# Patient Record
Sex: Female | Born: 2000
Health system: Southern US, Community
[De-identification: ages and names within clinical notes are randomized; demographics above are authoritative.]

## PROBLEM LIST (undated history)

## (undated) DIAGNOSIS — D6800 Von Willebrand disease, unspecified: Secondary | ICD-10-CM

## (undated) HISTORY — PX: NOSE SURGERY: SHX723

---
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
## 2018-01-13 ENCOUNTER — Ambulatory Visit (HOSPITAL_COMMUNITY)
Admission: EM | Admit: 2018-01-13 | Discharge: 2018-01-13 | Disposition: A | Discharge status: Home / Self Care | Payer: BLUE CROSS/BLUE SHIELD | Attending: Family Medicine | Admitting: Family Medicine

## 2018-01-13 ENCOUNTER — Encounter (HOSPITAL_COMMUNITY): Payer: Self-pay | Admitting: Emergency Medicine

## 2018-01-13 DIAGNOSIS — M25532 Pain in left wrist: Secondary | ICD-10-CM | POA: Diagnosis not present

## 2018-01-13 HISTORY — DX: Von Willebrand disease: D68.0

## 2018-01-13 HISTORY — DX: Von Willebrand disease, unspecified: D68.00

## 2018-01-13 MED ORDER — PREDNISONE 20 MG PO TABS: 40.0000 mg | ORAL_TABLET | Freq: Every day | ORAL | 0 refills | Status: AC

## 2018-01-13 MED ORDER — DICLOFENAC SODIUM 1 % TD GEL: 2.0000 g | Freq: Four times a day (QID) | TRANSDERMAL | 0 refills | Status: AC

## 2018-01-13 NOTE — ED Provider Notes (Signed)
[facility]    CSN: [id number] Arrival date & time: 01/13/18  1628     History   Chief Complaint Chief Complaint  Patient presents with  . Wrist Pain    HPI Angelica Christian is a 17 y.o. female.   17 year old female comes in for 3-day history of left wrist pain.  Patient states she has had history of fractures to both wrists, as well as tendinitis.  No known new injury.  Work does require repetitive motion of the wrist.  Patient with history of von Willebrand disease, she has been taking ibuprofen intermittently for the pain, but has been told not to take it excessively.  She denies any swelling.  Pain at rest, worse with movement.  Pain mostly on the radial aspect of the wrist, can radiate throughout her wrist during movement.  Denies numbness, tingling.  Denies decreased range of motion.      Past Medical History:  Diagnosis Date  . Von Willebrand disease (HCC)     There are no active problems to display for this patient.   Past Surgical History:  Procedure Laterality Date  . NOSE SURGERY      OB History    No data available       Home Medications    Prior to Admission medications   Medication Sig Start Date End Date Taking? Authorizing Provider  FLUoxetine (PROZAC) 20 MG tablet Take 20 mg by mouth daily.   Yes [provider]  NON FORMULARY    Yes [provider]  diclofenac sodium (VOLTAREN) 1 % GEL Apply 2 g topically 4 (four) times daily. 01/13/18   Cathie Hoops, Tamryn Popko V, PA-C  predniSONE (DELTASONE) 20 MG tablet Take 2 tablets (40 mg total) by mouth daily for 3 days. 01/13/18 01/16/18  Cathie Hoops, Carena Stream V, PA-C  triamcinolone (KENALOG) 0.1 % paste Use as directed 1 application in the mouth or throat 2 (two) times daily. 12/18/17   Mardella Layman, MD    Family History Family History  Problem Relation Age of Onset  . Hypertension Father     Social History Social History   Tobacco Use  . Smoking status: Never Smoker  Substance Use Topics  .  Alcohol use: Yes  . Drug use: No     Allergies   Patient has no known allergies.   Review of Systems Review of Systems  Reason unable to perform ROS: See HPI as above.     Physical Exam Triage Vital Signs ED Triage Vitals  Enc Vitals Group     BP 01/13/18 1712 117/61     Pulse Rate 01/13/18 1712 65     Resp 01/13/18 1712 16     Temp 01/13/18 1712 98.6 F (37 C)     Temp Source 01/13/18 1712 Oral     SpO2 01/13/18 1712 100 %     Weight 01/13/18 1713 145 lb (65.8 kg)     Height 01/13/18 1713 5\' 5"  (1.651 m)     Head Circumference --      Peak Flow --      Pain Score 01/13/18 1713 6     Pain Loc --      Pain Edu? --      Excl. in GC? --    No data found.  Updated Vital Signs BP 117/61   Pulse 65   Temp 98.6 F (37 C) (Oral)   Resp 16   Ht 5\' 5"  (1.651 m)   Wt 145 lb (65.8 kg)  LMP 12/23/2017   SpO2 100%   BMI 24.13 kg/m   Physical Exam  Constitutional: She is oriented to person, place, and time. She appears well-developed and well-nourished. No distress.  HENT:  Head: Normocephalic and atraumatic.  Eyes: Conjunctivae are normal. Pupils are equal, round, and reactive to light.  Musculoskeletal:  No obvious swelling, erythema, increased warmth, contusion.  Tenderness to palpation of radial aspect of wrist, along first MCP.  Tenderness to palpation of proximal MCPs.  Full range of motion, though flexion causes worsening of pain.  Strength normal and equal bilaterally.  Sensation intact ankle bilaterally.  Radial pulse 2+ and equal.  Cap refill less than 2 seconds.  Positive Finkelstein's.  Negative Tinel's, Phalen's.  Neurological: She is alert and oriented to person, place, and time.    UC Treatments / Results  Labs (all labs ordered are listed, but only abnormal results are displayed) Labs Reviewed - No data to display  EKG  EKG Interpretation None       Radiology No results found.  Procedures Procedures (including critical care  time)  Medications Ordered in UC Medications - No data to display   Initial Impression / Assessment and Plan / UC Course  I have reviewed the triage vital signs and the nursing notes.  Pertinent labs & imaging results that were available during my care of the patient were reviewed by me and considered in my medical decision making (see chart for details).    Given history of von Willebrand's, will defer oral NSAIDs for now.  Start prednisone for inflammation.  Tylenol for pain. Voltaren gel as needed for additional pain relief.  Wrist splint during activity.  Patient already established with orthopedics, will have patient follow-up with orthopedic for further evaluation and management needed.  Return precautions given.  Patient expresses understanding and agrees to plan.  Final Clinical Impressions(s) / UC Diagnoses   Final diagnoses:  Left wrist pain    ED Discharge Orders        Ordered    predniSONE (DELTASONE) 20 MG tablet  Daily     01/13/18 1803    diclofenac sodium (VOLTAREN) 1 % GEL  4 times daily     01/13/18 1804        Belinda FisherYu, Thorvald Orsino V, PA-C 01/13/18 1810

## 2018-01-13 NOTE — Discharge Instructions (Signed)
Start prednisone to decrease inflammation.  Ice compress.  Tylenol for pain.  Can use Voltaren gel as needed for pain relief.  Wrist splint during activity.  Follow-up with orthopedics for further evaluation and treatment needed.

## 2018-01-13 NOTE — ED Triage Notes (Signed)
PT reports severe left wrist pain since Friday. PT reports tendonitis in both wrists and previous breaks to both wrists. No new injury.

## 2018-01-26 ENCOUNTER — Ambulatory Visit (HOSPITAL_COMMUNITY)
Admission: EM | Admit: 2018-01-26 | Discharge: 2018-01-26 | Disposition: A | Discharge status: Home / Self Care | Payer: BLUE CROSS/BLUE SHIELD | Attending: Internal Medicine | Admitting: Internal Medicine

## 2018-01-26 ENCOUNTER — Other Ambulatory Visit: Payer: Self-pay

## 2018-01-26 DIAGNOSIS — J029 Acute pharyngitis, unspecified: Secondary | ICD-10-CM | POA: Insufficient documentation

## 2018-01-26 DIAGNOSIS — D680 Von Willebrand's disease: Secondary | ICD-10-CM | POA: Insufficient documentation

## 2018-01-26 LAB — POCT RAPID STREP A: Streptococcus, Group A Screen (Direct): NEGATIVE

## 2018-01-26 MED ORDER — LIDOCAINE VISCOUS 2 % MT SOLN: 5.0000 mL | OROMUCOSAL | 0 refills | Status: AC | PRN

## 2018-01-26 NOTE — ED Provider Notes (Signed)
01/26/2018 12:49 PM   DOB: [birth year] / MRN: [id number]  SUBJECTIVE:  Angelica Christian is a 17 y.o. female presenting for sore throat times 3 days.  Patient denies fever.  States she always has an elevated heart rate.  She denies cough, nasal congestion, nausea.  She is eating well.  Urinating normally for her.  She does have a history of von Willebrand is not supposed to take NSAIDs however she does from time to time.  She has also been taking amoxicillin without any relief.  She has no history of mono.  She has No Known Allergies.   She  has a past medical history of Von Willebrand disease (HCC).    She  reports that  has never smoked. She does not have any smokeless tobacco history on file. She reports that she drinks alcohol. She reports that she does not use drugs. She  has no sexual activity history on file. The patient  has a past surgical history that includes Nose surgery.  Her family history includes Hypertension in her father.  ROS  Per HPI  OBJECTIVE:  BP (!) 117/57 (BP Location: Left Arm)   Pulse (!) 116   Temp 98.4 F (36.9 C) (Oral)   LMP 01/08/2018 (Approximate)   SpO2 100%   Physical Exam  Constitutional: She is active.  Non-toxic appearance.  HENT:  Right Ear: Hearing, tympanic membrane, external ear and ear canal normal.  Left Ear: Hearing, tympanic membrane, external ear and ear canal normal.  Nose: Nose normal. Right sinus exhibits no maxillary sinus tenderness and no frontal sinus tenderness. Left sinus exhibits no maxillary sinus tenderness and no frontal sinus tenderness.  Mouth/Throat: Uvula is midline, oropharynx is clear and moist and mucous membranes are normal. Mucous membranes are not dry. No oropharyngeal exudate, posterior oropharyngeal edema or tonsillar abscesses.  Cardiovascular: Normal rate, regular rhythm, S1 normal, S2 normal, normal heart sounds and intact distal pulses. Exam reveals no gallop, no friction rub and no decreased pulses.  No murmur  heard. Heart rate 96 by auscultation.  Pulmonary/Chest: Effort normal. No stridor. No tachypnea. No respiratory distress. She has no wheezes. She has no rales.  Abdominal: She exhibits no distension.  Musculoskeletal: She exhibits no edema.  Lymphadenopathy:       Head (right side): No submandibular and no tonsillar adenopathy present.       Head (left side): No submandibular and no tonsillar adenopathy present.    She has no cervical adenopathy.  Neurological: She is alert.  Skin: Skin is warm and dry. She is not diaphoretic. No pallor.    Results for orders placed or performed during the hospital encounter of 01/26/18 (from the past 72 hour(s))  POCT rapid strep A [facility])     Status: None   Collection Time: 01/26/18 12:44 PM  Result Value Ref Range   Streptococcus, Group A Screen (Direct) NEGATIVE NEGATIVE    No results found.  ASSESSMENT AND PLAN:  Orders Placed This Encounter  Procedures  . Culture, group A strep (throat)    Standing Status:   Standing    Number of Occurrences:   1  . POCT rapid strep A [facility])    Standing Status:   Standing    Number of Occurrences:   1     Sore throat      The patient is advised to call or return to clinic if she does not see an improvement in symptoms, or to seek the care of  the closest emergency department if she worsens with the above plan.   Deliah BostonMichael Clark, MHS, PA-C 01/26/2018 12:49 PM    Ofilia Neaslark, Michael L, PA-C 01/26/18 1250

## 2018-01-26 NOTE — ED Triage Notes (Signed)
Sore throat that started on Friday, some headaches

## 2018-01-26 NOTE — Discharge Instructions (Signed)
For generalized aches, pains, sore throat is okay to take Tylenol [phone] mg every 6-8 hours.  It is okay to continue your amoxicillin at thousand milligrams twice daily.  It is unlikely that we will be able to pick up on strep today given that you have already been taking amoxicillin.  If your symptoms persist for greater than 10 days and you have no other symptoms, such as runny nose, cough, then this is likely mono.  Unfortunately there is no  cure however Tylenol should help ease the symptoms and in time, roughly 6 weeks you should be asymptomatic.

## 2018-01-29 LAB — CULTURE, GROUP A STREP (THRC)

## 2018-01-29 NOTE — Progress Notes (Signed)
Please call and advise she stop the zpack.  RTC or follow up with PCP if sore throat continues. Deliah BostonMichael Clark, MS, PA-C 4:24 PM, 01/29/2018

## 2018-04-10 DIAGNOSIS — R293 Abnormal posture: Secondary | ICD-10-CM | POA: Diagnosis not present

## 2018-04-10 DIAGNOSIS — G43109 Migraine with aura, not intractable, without status migrainosus: Secondary | ICD-10-CM | POA: Diagnosis not present

## 2018-04-10 DIAGNOSIS — M9902 Segmental and somatic dysfunction of thoracic region: Secondary | ICD-10-CM | POA: Diagnosis not present

## 2018-04-10 DIAGNOSIS — M9901 Segmental and somatic dysfunction of cervical region: Secondary | ICD-10-CM | POA: Diagnosis not present

## 2018-04-17 DIAGNOSIS — R293 Abnormal posture: Secondary | ICD-10-CM | POA: Diagnosis not present

## 2018-04-17 DIAGNOSIS — G43109 Migraine with aura, not intractable, without status migrainosus: Secondary | ICD-10-CM | POA: Diagnosis not present

## 2018-04-17 DIAGNOSIS — M9902 Segmental and somatic dysfunction of thoracic region: Secondary | ICD-10-CM | POA: Diagnosis not present

## 2018-04-17 DIAGNOSIS — M9901 Segmental and somatic dysfunction of cervical region: Secondary | ICD-10-CM | POA: Diagnosis not present

## 2018-04-22 DIAGNOSIS — M9902 Segmental and somatic dysfunction of thoracic region: Secondary | ICD-10-CM | POA: Diagnosis not present

## 2018-04-22 DIAGNOSIS — G43109 Migraine with aura, not intractable, without status migrainosus: Secondary | ICD-10-CM | POA: Diagnosis not present

## 2018-04-22 DIAGNOSIS — R293 Abnormal posture: Secondary | ICD-10-CM | POA: Diagnosis not present

## 2018-04-22 DIAGNOSIS — M9901 Segmental and somatic dysfunction of cervical region: Secondary | ICD-10-CM | POA: Diagnosis not present

## 2018-04-23 DIAGNOSIS — M9901 Segmental and somatic dysfunction of cervical region: Secondary | ICD-10-CM | POA: Diagnosis not present

## 2018-04-23 DIAGNOSIS — G43109 Migraine with aura, not intractable, without status migrainosus: Secondary | ICD-10-CM | POA: Diagnosis not present

## 2018-04-23 DIAGNOSIS — M9902 Segmental and somatic dysfunction of thoracic region: Secondary | ICD-10-CM | POA: Diagnosis not present

## 2018-04-23 DIAGNOSIS — R293 Abnormal posture: Secondary | ICD-10-CM | POA: Diagnosis not present

## 2018-04-24 DIAGNOSIS — G43109 Migraine with aura, not intractable, without status migrainosus: Secondary | ICD-10-CM | POA: Diagnosis not present

## 2018-04-24 DIAGNOSIS — M9901 Segmental and somatic dysfunction of cervical region: Secondary | ICD-10-CM | POA: Diagnosis not present

## 2018-04-24 DIAGNOSIS — R293 Abnormal posture: Secondary | ICD-10-CM | POA: Diagnosis not present

## 2018-04-24 DIAGNOSIS — M9902 Segmental and somatic dysfunction of thoracic region: Secondary | ICD-10-CM | POA: Diagnosis not present

## 2018-04-29 DIAGNOSIS — M9901 Segmental and somatic dysfunction of cervical region: Secondary | ICD-10-CM | POA: Diagnosis not present

## 2018-04-29 DIAGNOSIS — M9902 Segmental and somatic dysfunction of thoracic region: Secondary | ICD-10-CM | POA: Diagnosis not present

## 2018-04-29 DIAGNOSIS — R293 Abnormal posture: Secondary | ICD-10-CM | POA: Diagnosis not present

## 2018-04-29 DIAGNOSIS — G43109 Migraine with aura, not intractable, without status migrainosus: Secondary | ICD-10-CM | POA: Diagnosis not present

## 2018-04-30 DIAGNOSIS — M9902 Segmental and somatic dysfunction of thoracic region: Secondary | ICD-10-CM | POA: Diagnosis not present

## 2018-04-30 DIAGNOSIS — R293 Abnormal posture: Secondary | ICD-10-CM | POA: Diagnosis not present

## 2018-04-30 DIAGNOSIS — G43109 Migraine with aura, not intractable, without status migrainosus: Secondary | ICD-10-CM | POA: Diagnosis not present

## 2018-04-30 DIAGNOSIS — M9901 Segmental and somatic dysfunction of cervical region: Secondary | ICD-10-CM | POA: Diagnosis not present

## 2018-05-01 DIAGNOSIS — M9902 Segmental and somatic dysfunction of thoracic region: Secondary | ICD-10-CM | POA: Diagnosis not present

## 2018-05-01 DIAGNOSIS — R293 Abnormal posture: Secondary | ICD-10-CM | POA: Diagnosis not present

## 2018-05-01 DIAGNOSIS — M9901 Segmental and somatic dysfunction of cervical region: Secondary | ICD-10-CM | POA: Diagnosis not present

## 2018-05-01 DIAGNOSIS — G43109 Migraine with aura, not intractable, without status migrainosus: Secondary | ICD-10-CM | POA: Diagnosis not present

## 2018-05-13 DIAGNOSIS — R293 Abnormal posture: Secondary | ICD-10-CM | POA: Diagnosis not present

## 2018-05-13 DIAGNOSIS — G43109 Migraine with aura, not intractable, without status migrainosus: Secondary | ICD-10-CM | POA: Diagnosis not present

## 2018-05-13 DIAGNOSIS — M9902 Segmental and somatic dysfunction of thoracic region: Secondary | ICD-10-CM | POA: Diagnosis not present

## 2018-05-13 DIAGNOSIS — M9901 Segmental and somatic dysfunction of cervical region: Secondary | ICD-10-CM | POA: Diagnosis not present

## 2018-05-14 DIAGNOSIS — R293 Abnormal posture: Secondary | ICD-10-CM | POA: Diagnosis not present

## 2018-05-14 DIAGNOSIS — G43109 Migraine with aura, not intractable, without status migrainosus: Secondary | ICD-10-CM | POA: Diagnosis not present

## 2018-05-14 DIAGNOSIS — M9902 Segmental and somatic dysfunction of thoracic region: Secondary | ICD-10-CM | POA: Diagnosis not present

## 2018-05-14 DIAGNOSIS — M9901 Segmental and somatic dysfunction of cervical region: Secondary | ICD-10-CM | POA: Diagnosis not present

## 2018-05-15 DIAGNOSIS — G43109 Migraine with aura, not intractable, without status migrainosus: Secondary | ICD-10-CM | POA: Diagnosis not present

## 2018-05-15 DIAGNOSIS — M9901 Segmental and somatic dysfunction of cervical region: Secondary | ICD-10-CM | POA: Diagnosis not present

## 2018-05-15 DIAGNOSIS — R293 Abnormal posture: Secondary | ICD-10-CM | POA: Diagnosis not present

## 2018-05-15 DIAGNOSIS — M9902 Segmental and somatic dysfunction of thoracic region: Secondary | ICD-10-CM | POA: Diagnosis not present

## 2018-05-29 DIAGNOSIS — M9902 Segmental and somatic dysfunction of thoracic region: Secondary | ICD-10-CM | POA: Diagnosis not present

## 2018-05-29 DIAGNOSIS — M9901 Segmental and somatic dysfunction of cervical region: Secondary | ICD-10-CM | POA: Diagnosis not present

## 2018-05-29 DIAGNOSIS — R293 Abnormal posture: Secondary | ICD-10-CM | POA: Diagnosis not present

## 2018-05-29 DIAGNOSIS — G43109 Migraine with aura, not intractable, without status migrainosus: Secondary | ICD-10-CM | POA: Diagnosis not present

## 2018-06-12 ENCOUNTER — Encounter: Payer: BLUE CROSS/BLUE SHIELD | Admitting: Obstetrics and Gynecology

## 2018-06-17 DIAGNOSIS — R293 Abnormal posture: Secondary | ICD-10-CM | POA: Diagnosis not present

## 2018-06-17 DIAGNOSIS — M9901 Segmental and somatic dysfunction of cervical region: Secondary | ICD-10-CM | POA: Diagnosis not present

## 2018-06-17 DIAGNOSIS — G43109 Migraine with aura, not intractable, without status migrainosus: Secondary | ICD-10-CM | POA: Diagnosis not present

## 2018-06-17 DIAGNOSIS — M9902 Segmental and somatic dysfunction of thoracic region: Secondary | ICD-10-CM | POA: Diagnosis not present

## 2018-06-18 DIAGNOSIS — G43109 Migraine with aura, not intractable, without status migrainosus: Secondary | ICD-10-CM | POA: Diagnosis not present

## 2018-06-18 DIAGNOSIS — R293 Abnormal posture: Secondary | ICD-10-CM | POA: Diagnosis not present

## 2018-06-18 DIAGNOSIS — M9902 Segmental and somatic dysfunction of thoracic region: Secondary | ICD-10-CM | POA: Diagnosis not present

## 2018-06-18 DIAGNOSIS — M9901 Segmental and somatic dysfunction of cervical region: Secondary | ICD-10-CM | POA: Diagnosis not present

## 2018-06-19 DIAGNOSIS — M9901 Segmental and somatic dysfunction of cervical region: Secondary | ICD-10-CM | POA: Diagnosis not present

## 2018-06-19 DIAGNOSIS — G43109 Migraine with aura, not intractable, without status migrainosus: Secondary | ICD-10-CM | POA: Diagnosis not present

## 2018-06-19 DIAGNOSIS — M9902 Segmental and somatic dysfunction of thoracic region: Secondary | ICD-10-CM | POA: Diagnosis not present

## 2018-06-19 DIAGNOSIS — R293 Abnormal posture: Secondary | ICD-10-CM | POA: Diagnosis not present

## 2018-06-24 DIAGNOSIS — G43109 Migraine with aura, not intractable, without status migrainosus: Secondary | ICD-10-CM | POA: Diagnosis not present

## 2018-06-24 DIAGNOSIS — M9902 Segmental and somatic dysfunction of thoracic region: Secondary | ICD-10-CM | POA: Diagnosis not present

## 2018-06-24 DIAGNOSIS — M9901 Segmental and somatic dysfunction of cervical region: Secondary | ICD-10-CM | POA: Diagnosis not present

## 2018-06-24 DIAGNOSIS — R293 Abnormal posture: Secondary | ICD-10-CM | POA: Diagnosis not present

## 2018-06-26 DIAGNOSIS — M9901 Segmental and somatic dysfunction of cervical region: Secondary | ICD-10-CM | POA: Diagnosis not present

## 2018-06-26 DIAGNOSIS — R293 Abnormal posture: Secondary | ICD-10-CM | POA: Diagnosis not present

## 2018-06-26 DIAGNOSIS — M9902 Segmental and somatic dysfunction of thoracic region: Secondary | ICD-10-CM | POA: Diagnosis not present

## 2018-06-26 DIAGNOSIS — G43109 Migraine with aura, not intractable, without status migrainosus: Secondary | ICD-10-CM | POA: Diagnosis not present

## 2018-06-27 ENCOUNTER — Encounter: Payer: BLUE CROSS/BLUE SHIELD | Admitting: Obstetrics and Gynecology

## 2018-07-01 DIAGNOSIS — M9902 Segmental and somatic dysfunction of thoracic region: Secondary | ICD-10-CM | POA: Diagnosis not present

## 2018-07-01 DIAGNOSIS — G43109 Migraine with aura, not intractable, without status migrainosus: Secondary | ICD-10-CM | POA: Diagnosis not present

## 2018-07-01 DIAGNOSIS — M9901 Segmental and somatic dysfunction of cervical region: Secondary | ICD-10-CM | POA: Diagnosis not present

## 2018-07-01 DIAGNOSIS — R293 Abnormal posture: Secondary | ICD-10-CM | POA: Diagnosis not present

## 2018-07-03 DIAGNOSIS — M9902 Segmental and somatic dysfunction of thoracic region: Secondary | ICD-10-CM | POA: Diagnosis not present

## 2018-07-03 DIAGNOSIS — R293 Abnormal posture: Secondary | ICD-10-CM | POA: Diagnosis not present

## 2018-07-03 DIAGNOSIS — G43109 Migraine with aura, not intractable, without status migrainosus: Secondary | ICD-10-CM | POA: Diagnosis not present

## 2018-07-03 DIAGNOSIS — M9901 Segmental and somatic dysfunction of cervical region: Secondary | ICD-10-CM | POA: Diagnosis not present

## 2018-08-14 DIAGNOSIS — J302 Other seasonal allergic rhinitis: Secondary | ICD-10-CM | POA: Diagnosis not present

## 2018-08-22 DIAGNOSIS — Z113 Encounter for screening for infections with a predominantly sexual mode of transmission: Secondary | ICD-10-CM | POA: Diagnosis not present

## 2018-08-22 DIAGNOSIS — Z01419 Encounter for gynecological examination (general) (routine) without abnormal findings: Secondary | ICD-10-CM | POA: Diagnosis not present

## 2018-08-22 DIAGNOSIS — Z6826 Body mass index (BMI) 26.0-26.9, adult: Secondary | ICD-10-CM | POA: Diagnosis not present

## 2018-09-02 DIAGNOSIS — R05 Cough: Secondary | ICD-10-CM | POA: Diagnosis not present

## 2018-09-02 DIAGNOSIS — Z23 Encounter for immunization: Secondary | ICD-10-CM | POA: Diagnosis not present

## 2018-09-02 DIAGNOSIS — J029 Acute pharyngitis, unspecified: Secondary | ICD-10-CM | POA: Diagnosis not present

## 2018-09-02 DIAGNOSIS — R509 Fever, unspecified: Secondary | ICD-10-CM | POA: Diagnosis not present

## 2018-10-29 DIAGNOSIS — J069 Acute upper respiratory infection, unspecified: Secondary | ICD-10-CM | POA: Diagnosis not present

## 2018-10-29 DIAGNOSIS — R05 Cough: Secondary | ICD-10-CM | POA: Diagnosis not present

## 2018-10-29 DIAGNOSIS — R062 Wheezing: Secondary | ICD-10-CM | POA: Diagnosis not present

## 2018-11-20 DIAGNOSIS — J40 Bronchitis, not specified as acute or chronic: Secondary | ICD-10-CM | POA: Diagnosis not present

## 2018-11-20 DIAGNOSIS — B9689 Other specified bacterial agents as the cause of diseases classified elsewhere: Secondary | ICD-10-CM | POA: Diagnosis not present

## 2018-11-20 DIAGNOSIS — J329 Chronic sinusitis, unspecified: Secondary | ICD-10-CM | POA: Diagnosis not present

## 2018-12-17 DIAGNOSIS — Z23 Encounter for immunization: Secondary | ICD-10-CM | POA: Diagnosis not present

## 2018-12-17 DIAGNOSIS — Z13220 Encounter for screening for lipoid disorders: Secondary | ICD-10-CM | POA: Diagnosis not present

## 2018-12-17 DIAGNOSIS — Z0000 Encounter for general adult medical examination without abnormal findings: Secondary | ICD-10-CM | POA: Diagnosis not present

## 2018-12-17 DIAGNOSIS — F419 Anxiety disorder, unspecified: Secondary | ICD-10-CM | POA: Diagnosis not present

## 2018-12-17 DIAGNOSIS — R5383 Other fatigue: Secondary | ICD-10-CM | POA: Diagnosis not present

## 2018-12-17 DIAGNOSIS — Z6826 Body mass index (BMI) 26.0-26.9, adult: Secondary | ICD-10-CM | POA: Diagnosis not present

## 2019-01-22 DIAGNOSIS — Z23 Encounter for immunization: Secondary | ICD-10-CM | POA: Diagnosis not present

## 2019-03-05 DIAGNOSIS — R300 Dysuria: Secondary | ICD-10-CM | POA: Diagnosis not present

## 2019-03-09 DIAGNOSIS — F909 Attention-deficit hyperactivity disorder, unspecified type: Secondary | ICD-10-CM | POA: Diagnosis not present

## 2019-08-15 DIAGNOSIS — Z20828 Contact with and (suspected) exposure to other viral communicable diseases: Secondary | ICD-10-CM | POA: Diagnosis not present

## 2019-08-15 DIAGNOSIS — R51 Headache: Secondary | ICD-10-CM | POA: Diagnosis not present

## 2019-08-15 DIAGNOSIS — R070 Pain in throat: Secondary | ICD-10-CM | POA: Diagnosis not present

## 2019-09-21 DIAGNOSIS — Z23 Encounter for immunization: Secondary | ICD-10-CM | POA: Diagnosis not present

## 2019-09-21 DIAGNOSIS — F419 Anxiety disorder, unspecified: Secondary | ICD-10-CM | POA: Diagnosis not present

## 2019-09-22 DIAGNOSIS — Z6828 Body mass index (BMI) 28.0-28.9, adult: Secondary | ICD-10-CM | POA: Diagnosis not present

## 2019-09-22 DIAGNOSIS — Z01419 Encounter for gynecological examination (general) (routine) without abnormal findings: Secondary | ICD-10-CM | POA: Diagnosis not present

## 2019-09-22 DIAGNOSIS — Z113 Encounter for screening for infections with a predominantly sexual mode of transmission: Secondary | ICD-10-CM | POA: Diagnosis not present

## 2020-01-18 ENCOUNTER — Encounter (HOSPITAL_COMMUNITY): Payer: Self-pay | Admitting: Emergency Medicine

## 2020-01-18 ENCOUNTER — Emergency Department (HOSPITAL_COMMUNITY): Payer: BC Managed Care – PPO

## 2020-01-18 ENCOUNTER — Emergency Department (HOSPITAL_COMMUNITY)
Admission: EM | Admit: 2020-01-18 | Discharge: 2020-01-18 | Disposition: A | Discharge status: Home / Self Care | Payer: BC Managed Care – PPO | Attending: Emergency Medicine | Admitting: Emergency Medicine

## 2020-01-18 DIAGNOSIS — Y999 Unspecified external cause status: Secondary | ICD-10-CM | POA: Insufficient documentation

## 2020-01-18 DIAGNOSIS — S3992XA Unspecified injury of lower back, initial encounter: Secondary | ICD-10-CM | POA: Diagnosis not present

## 2020-01-18 DIAGNOSIS — Y9389 Activity, other specified: Secondary | ICD-10-CM | POA: Diagnosis not present

## 2020-01-18 DIAGNOSIS — R519 Headache, unspecified: Secondary | ICD-10-CM | POA: Diagnosis not present

## 2020-01-18 DIAGNOSIS — Y92410 Unspecified street and highway as the place of occurrence of the external cause: Secondary | ICD-10-CM | POA: Diagnosis not present

## 2020-01-18 DIAGNOSIS — M546 Pain in thoracic spine: Secondary | ICD-10-CM | POA: Diagnosis not present

## 2020-01-18 DIAGNOSIS — M542 Cervicalgia: Secondary | ICD-10-CM | POA: Insufficient documentation

## 2020-01-18 DIAGNOSIS — M549 Dorsalgia, unspecified: Secondary | ICD-10-CM | POA: Insufficient documentation

## 2020-01-18 DIAGNOSIS — M4322 Fusion of spine, cervical region: Secondary | ICD-10-CM | POA: Diagnosis not present

## 2020-01-18 DIAGNOSIS — M545 Low back pain: Secondary | ICD-10-CM | POA: Diagnosis not present

## 2020-01-18 DIAGNOSIS — S299XXA Unspecified injury of thorax, initial encounter: Secondary | ICD-10-CM | POA: Diagnosis not present

## 2020-01-18 DIAGNOSIS — S0990XA Unspecified injury of head, initial encounter: Secondary | ICD-10-CM | POA: Diagnosis not present

## 2020-01-18 DIAGNOSIS — Z79899 Other long term (current) drug therapy: Secondary | ICD-10-CM | POA: Diagnosis not present

## 2020-01-18 DIAGNOSIS — S199XXA Unspecified injury of neck, initial encounter: Secondary | ICD-10-CM | POA: Diagnosis not present

## 2020-01-18 LAB — POC URINE PREG, ED: Preg Test, Ur: NEGATIVE

## 2020-01-18 MED ORDER — METOCLOPRAMIDE HCL 10 MG PO TABS
10.0000 mg | ORAL_TABLET | Freq: Once | ORAL | Status: AC
  Administered 2020-01-18: 10 mg via ORAL
  Filled 2020-01-18: qty 1

## 2020-01-18 MED ORDER — METHOCARBAMOL 500 MG PO TABS: 500.0000 mg | ORAL_TABLET | Freq: Two times a day (BID) | ORAL | 0 refills | Status: AC

## 2020-01-18 MED ORDER — DEXAMETHASONE SODIUM PHOSPHATE 10 MG/ML IJ SOLN
10.0000 mg | Freq: Once | INTRAMUSCULAR | Status: AC
  Administered 2020-01-18: 10 mg via INTRAMUSCULAR
  Filled 2020-01-18: qty 1

## 2020-01-18 MED ORDER — METOCLOPRAMIDE HCL 10 MG PO TABS: 10.0000 mg | ORAL_TABLET | Freq: Four times a day (QID) | ORAL | 0 refills | Status: AC

## 2020-01-18 MED ORDER — ACETAMINOPHEN 325 MG PO TABS
650.0000 mg | ORAL_TABLET | Freq: Once | ORAL | Status: AC
  Administered 2020-01-18: 650 mg via ORAL
  Filled 2020-01-18: qty 2

## 2020-01-18 MED ORDER — LIDOCAINE 5 % EX PTCH: 1.0000 | MEDICATED_PATCH | CUTANEOUS | 0 refills | Status: AC

## 2020-01-18 NOTE — ED Notes (Signed)
Pt given cheese and crackers 
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
## 2020-01-18 NOTE — ED Notes (Signed)
Pt up to restroom.
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
## 2020-01-18 NOTE — Discharge Instructions (Signed)
Expect your soreness to increase over the next 2-3 days. Take it easy, but do not lay around too much as this may make any stiffness worse.  Antiinflammatory medications: Take 600 mg of ibuprofen every 6 hours or 440 mg (over the counter dose) to 500 mg (prescription dose) of naproxen every 12 hours for the next 3 days. After this time, these medications may be used as needed for pain. Take these medications with food to avoid upset stomach. Choose only one of these medications, do not take them together. Acetaminophen (generic for Tylenol): Should you continue to have additional pain while taking the ibuprofen or naproxen, you may add in acetaminophen as needed. Your daily total maximum amount of acetaminophen from all sources should be limited to 4000mg /day for persons without liver problems, or 2000mg /day for those with liver problems. Methocarbamol: Methocarbamol (generic for Robaxin) is a muscle relaxer and can help relieve stiff muscles or muscle spasms.  Do not drive or perform other dangerous activities while taking this medication as it can cause drowsiness as well as changes in reaction time and judgement. Lidocaine patches: These are available via either prescription or over-the-counter. The over-the-counter option may be more economical one and are likely just as effective. There are multiple over-the-counter brands, such as Salonpas. Ice: May apply ice to the area over the next 24 hours for 15 minutes at a time to reduce pain, inflammation, and swelling, if present. Exercises: Be sure to perform the attached exercises starting with three times a week and working up to performing them daily. This is an essential part of preventing long term problems.  Follow up: Follow up with a primary care provider for any future management of these complaints. Be sure to follow up within 7-10 days. Return: Return to the ED should symptoms worsen.  For prescription assistance, may try using prescription  discount sites or apps, such as goodrx.com   Head Injury or Concussion symptoms You have been seen today for a possible concussion. It does not appear to be serious at this time.  Close observation: The close observation period is usually 6 hours from the injury. This includes staying awake and having a trustworthy adult monitor you to assure your condition does not worsen. You should be in regular contact with this person and ideally, they should be able to monitor you in person.  Secondary observation: The secondary observation period is usually 24 hours from the injury. You are allowed to sleep during this time. A trustworthy adult should intermittently monitor you to assure your condition does not worsen.   Overall head injury/concussion care: Rest: Be sure to get plenty of rest. You will need more rest and sleep while you recover. Hydration: Be sure to stay well hydrated by having a goal of drinking about 0.5 liters of water an hour. Pain:  Antiinflammatory medications: Take 600 mg of ibuprofen every 6 hours or 440 mg (over the counter dose) to 500 mg (prescription dose) of naproxen every 12 hours or for the next 3 days. After this time, these medications may be used as needed for pain. Take these medications with food to avoid upset stomach. Choose only one of these medications, do not take them together. Tylenol: Should you continue to have additional pain while taking the ibuprofen or naproxen, you may add in tylenol as needed. Your daily total maximum amount of tylenol from all sources should be limited to 4000mg /day for persons without liver problems, or 2000mg /day for those with liver problems. Return  to sports and activities: In general, you may return to normal activities once symptoms have subsided, however, you would ideally be cleared by a primary care provider or other qualified medical professional prior to return to these activities.  Follow up: Follow up with the concussion clinic  or your primary care provider for further management of this issue. Return: Return to the ED should you begin to have confusion, abnormal behavior, aggression, violence, or personality changes, repeated vomiting, vision loss, numbness or weakness on one side of the body, difficulty standing due to dizziness, significantly worsening pain, or any other major concerns.

## 2020-01-18 NOTE — ED Notes (Signed)
Discharge instructions reviewed with patient; pt verbalized understanding.

## 2020-03-02 DIAGNOSIS — K644 Residual hemorrhoidal skin tags: Secondary | ICD-10-CM | POA: Diagnosis not present

## 2020-03-02 DIAGNOSIS — F419 Anxiety disorder, unspecified: Secondary | ICD-10-CM | POA: Diagnosis not present

## 2020-05-31 DIAGNOSIS — Z0000 Encounter for general adult medical examination without abnormal findings: Secondary | ICD-10-CM | POA: Diagnosis not present

## 2020-05-31 DIAGNOSIS — R635 Abnormal weight gain: Secondary | ICD-10-CM | POA: Diagnosis not present

## 2020-05-31 DIAGNOSIS — Z23 Encounter for immunization: Secondary | ICD-10-CM | POA: Diagnosis not present

## 2020-05-31 DIAGNOSIS — Z1331 Encounter for screening for depression: Secondary | ICD-10-CM | POA: Diagnosis not present

## 2020-05-31 DIAGNOSIS — Z6831 Body mass index (BMI) 31.0-31.9, adult: Secondary | ICD-10-CM | POA: Diagnosis not present

## 2020-05-31 DIAGNOSIS — Z13220 Encounter for screening for lipoid disorders: Secondary | ICD-10-CM | POA: Diagnosis not present

## 2020-07-29 DIAGNOSIS — R519 Headache, unspecified: Secondary | ICD-10-CM | POA: Diagnosis not present

## 2020-07-29 DIAGNOSIS — Z20828 Contact with and (suspected) exposure to other viral communicable diseases: Secondary | ICD-10-CM | POA: Diagnosis not present

## 2020-07-29 DIAGNOSIS — R1110 Vomiting, unspecified: Secondary | ICD-10-CM | POA: Diagnosis not present

## 2020-10-26 DIAGNOSIS — Z01419 Encounter for gynecological examination (general) (routine) without abnormal findings: Secondary | ICD-10-CM | POA: Diagnosis not present

## 2020-10-26 DIAGNOSIS — Z113 Encounter for screening for infections with a predominantly sexual mode of transmission: Secondary | ICD-10-CM | POA: Diagnosis not present

## 2020-10-26 DIAGNOSIS — Z124 Encounter for screening for malignant neoplasm of cervix: Secondary | ICD-10-CM | POA: Diagnosis not present

## 2020-10-26 DIAGNOSIS — Z3041 Encounter for surveillance of contraceptive pills: Secondary | ICD-10-CM | POA: Diagnosis not present
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
